# Patient Record
Sex: Male | Born: 1995 | Race: Black or African American | Hispanic: No | Marital: Single | Smoking: Former smoker
Health system: Southern US, Community
[De-identification: ages and names within clinical notes are randomized; demographics above are authoritative.]

---
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
## 2020-08-31 ENCOUNTER — Other Ambulatory Visit: Payer: Self-pay

## 2020-08-31 ENCOUNTER — Emergency Department (HOSPITAL_BASED_OUTPATIENT_CLINIC_OR_DEPARTMENT_OTHER)
Admission: EM | Admit: 2020-08-31 | Discharge: 2020-09-01 | Disposition: A | Discharge status: Home / Self Care | Payer: Self-pay | Attending: Emergency Medicine | Admitting: Emergency Medicine

## 2020-08-31 ENCOUNTER — Encounter (HOSPITAL_BASED_OUTPATIENT_CLINIC_OR_DEPARTMENT_OTHER): Payer: Self-pay | Admitting: Emergency Medicine

## 2020-08-31 DIAGNOSIS — F17200 Nicotine dependence, unspecified, uncomplicated: Secondary | ICD-10-CM | POA: Insufficient documentation

## 2020-08-31 DIAGNOSIS — S0232XA Fracture of orbital floor, left side, initial encounter for closed fracture: Secondary | ICD-10-CM | POA: Insufficient documentation
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
## 2020-09-01 ENCOUNTER — Emergency Department (HOSPITAL_BASED_OUTPATIENT_CLINIC_OR_DEPARTMENT_OTHER): Payer: Self-pay

## 2020-09-01 MED ORDER — ACETAMINOPHEN 500 MG PO TABS
1000.0000 mg | ORAL_TABLET | Freq: Once | ORAL | Status: AC
  Administered 2020-09-01: 1000 mg via ORAL
  Filled 2020-09-01: qty 2

## 2020-09-01 MED ORDER — AMOXICILLIN 500 MG PO CAPS: 500.0000 mg | ORAL_CAPSULE | Freq: Three times a day (TID) | ORAL | 0 refills | Status: DC

## 2020-09-01 NOTE — ED Notes (Signed)
ED Provider at bedside. 
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
## 2022-09-16 ENCOUNTER — Other Ambulatory Visit: Payer: Self-pay

## 2022-09-16 ENCOUNTER — Encounter (HOSPITAL_BASED_OUTPATIENT_CLINIC_OR_DEPARTMENT_OTHER): Payer: Self-pay | Admitting: Emergency Medicine

## 2022-09-16 ENCOUNTER — Emergency Department (HOSPITAL_BASED_OUTPATIENT_CLINIC_OR_DEPARTMENT_OTHER)
Admission: EM | Admit: 2022-09-16 | Discharge: 2022-09-16 | Disposition: A | Discharge status: Home / Self Care | Payer: Self-pay | Attending: Emergency Medicine | Admitting: Emergency Medicine

## 2022-09-16 DIAGNOSIS — Z1152 Encounter for screening for COVID-19: Secondary | ICD-10-CM | POA: Insufficient documentation

## 2022-09-16 DIAGNOSIS — J0101 Acute recurrent maxillary sinusitis: Secondary | ICD-10-CM | POA: Insufficient documentation

## 2022-09-16 DIAGNOSIS — J029 Acute pharyngitis, unspecified: Secondary | ICD-10-CM | POA: Insufficient documentation

## 2022-09-16 DIAGNOSIS — J0100 Acute maxillary sinusitis, unspecified: Secondary | ICD-10-CM

## 2022-09-16 DIAGNOSIS — Z720 Tobacco use: Secondary | ICD-10-CM | POA: Insufficient documentation

## 2022-09-16 LAB — MONONUCLEOSIS SCREEN: Mono Screen: NEGATIVE

## 2022-09-16 LAB — RESP PANEL BY RT-PCR (RSV, FLU A&B, COVID)  RVPGX2
Influenza A by PCR: NEGATIVE
Influenza B by PCR: NEGATIVE
Resp Syncytial Virus by PCR: NEGATIVE
SARS Coronavirus 2 by RT PCR: NEGATIVE

## 2022-09-16 LAB — GROUP A STREP BY PCR: Group A Strep by PCR: NOT DETECTED

## 2022-09-16 MED ORDER — DOXYCYCLINE HYCLATE 100 MG PO CAPS: 100.0000 mg | ORAL_CAPSULE | Freq: Two times a day (BID) | ORAL | 0 refills | Status: DC

## 2022-09-16 MED ORDER — DOXYCYCLINE HYCLATE 100 MG PO TABS
100.0000 mg | ORAL_TABLET | Freq: Once | ORAL | Status: AC
  Administered 2022-09-16: 100 mg via ORAL
  Filled 2022-09-16: qty 1

## 2022-09-16 MED ORDER — KETOROLAC TROMETHAMINE 30 MG/ML IJ SOLN
30.0000 mg | Freq: Once | INTRAMUSCULAR | Status: AC
  Administered 2022-09-16: 30 mg via INTRAMUSCULAR
  Filled 2022-09-16: qty 1

## 2022-09-16 MED ORDER — DEXAMETHASONE SODIUM PHOSPHATE 10 MG/ML IJ SOLN
10.0000 mg | Freq: Once | INTRAMUSCULAR | Status: AC
  Administered 2022-09-16: 10 mg via INTRAMUSCULAR
  Filled 2022-09-16: qty 1

## 2022-09-16 MED ORDER — IBUPROFEN 600 MG PO TABS: 600.0000 mg | ORAL_TABLET | Freq: Four times a day (QID) | ORAL | 0 refills | Status: DC | PRN

## 2022-09-16 NOTE — ED Triage Notes (Signed)
Pt reports he went to urgent care yesterday for head congestion and URI symptoms. Was given promethazine, but says throat and eyes are more swollen today and feels hot. Pt speaking in clear sentences. Denies itching or emesis.

## 2022-09-16 NOTE — ED Provider Notes (Signed)
EMERGENCY DEPARTMENT AT [facility] Provider Note   CSN: [id number] Arrival date & time: 09/16/22  0750     History  Chief Complaint  Patient presents with   Sore Throat    David Douglas is a 27 y.o. male.  Pt is a 27 yo male with no significant pmhx.  Pt said he's had uri sx for the past week.  [facility] and [facility] for covid/flu and strep.  These were neg.  He was given a rx for tessalon and phenergan dm.  He woke up today feeling worse.  He has some swelling to the left side of his face which is new.  No fevers.       Home Medications Prior to Admission medications   Medication Sig Start Date End Date Taking? Authorizing Provider  doxycycline (VIBRAMYCIN) 100 MG capsule Take 1 capsule (100 mg total) by mouth 2 (two) times daily. 09/16/22  Yes Jacalyn Lefevre, MD  ibuprofen (ADVIL) 600 MG tablet Take 1 tablet (600 mg total) by mouth every 6 (six) hours as needed. 09/16/22  Yes Jacalyn Lefevre, MD  promethazine (PHENERGAN) 12.5 MG tablet Take 12.5 mg by mouth every 6 (six) hours as needed for nausea or vomiting.   Yes [provider]  amoxicillin (AMOXIL) 500 MG capsule Take 1 capsule (500 mg total) by mouth 3 (three) times daily. 09/01/20   Tilden Fossa, MD      Allergies    Patient has no known allergies.    Review of Systems   Review of Systems  HENT:  Positive for facial swelling, rhinorrhea, sinus pressure, sinus pain and sore throat.   All other systems reviewed and are negative.   Physical Exam Updated Vital Signs BP (!) 146/78 (BP Location: Right Arm)   Pulse (!) 106   Temp 99.2 F (37.3 C) (Oral)   Resp 20   SpO2 97%  Physical Exam Vitals and nursing note reviewed.  Constitutional:      Appearance: He is well-developed.  HENT:     Head: Normocephalic and atraumatic.     Comments: Mild swelling under left eye.    Right Ear: Tympanic membrane and ear canal normal.     Left Ear: Tympanic membrane  and ear canal normal.     Nose: Congestion present.     Right Sinus: Maxillary sinus tenderness present.     Left Sinus: Maxillary sinus tenderness present.     Mouth/Throat:     Mouth: Mucous membranes are moist.  Eyes:     Conjunctiva/sclera: Conjunctivae normal.     Pupils: Pupils are equal, round, and reactive to light.  Cardiovascular:     Rate and Rhythm: Normal rate and regular rhythm.     Heart sounds: Normal heart sounds.  Pulmonary:     Effort: Pulmonary effort is normal.     Breath sounds: Normal breath sounds.  Abdominal:     General: Bowel sounds are normal.     Palpations: Abdomen is soft.  Musculoskeletal:     Cervical back: Normal range of motion and neck supple.  Skin:    General: Skin is warm.     Capillary Refill: Capillary refill takes less than 2 seconds.  Neurological:     General: No focal deficit present.     Mental Status: He is alert and oriented to person, place, and time.  Psychiatric:        Mood and Affect: Mood normal.  Behavior: Behavior normal.     ED Results / Procedures / Treatments   Labs (all labs ordered are listed, but only abnormal results are displayed) Labs Reviewed  RESP PANEL BY RT-PCR (RSV, FLU A&B, COVID)  RVPGX2  GROUP A STREP BY PCR  MONONUCLEOSIS SCREEN    EKG None  Radiology No results found.  Procedures Procedures    Medications Ordered in ED Medications  doxycycline (VIBRA-TABS) tablet 100 mg (has no administration in time range)  ketorolac (TORADOL) 30 MG/ML injection 30 mg (30 mg Intramuscular Given 09/16/22 0816)  dexamethasone (DECADRON) injection 10 mg (10 mg Intramuscular Given 09/16/22 1610)    ED Course/ Medical Decision Making/ A&P                             Medical Decision Making Amount and/or Complexity of Data Reviewed Labs: ordered.  Risk Prescription drug management.   This patient presents to the ED for concern of uri sx, this involves an extensive number of treatment  options, and is a complaint that carries with it a high risk of complications and morbidity.  The differential diagnosis includes covid/flu/rsv, strep, bronchitis, sinusitis   Co morbidities that complicate the patient evaluation  none   Additional history obtained:  Additional history obtained from epic chart review  Lab Tests:  I Ordered, and personally interpreted labs.  The pertinent results include:  monospot neg, strep neg, covid/flu/rsv neg   Medicines ordered and prescription drug management:  I ordered medication including toradol and decadron  for sx  Reevaluation of the patient after these medicines showed that the patient improved I have reviewed the patients home medicines and have made adjustments as needed   Problem List / ED Course:  URI sx:  sx have been going on for a week, so I will start him on abx.  Pt is stable for d/c.  Return if worse. Tobacco abuse:  pt encouraged to try to stop smoking.   Reevaluation:  After the interventions noted above, I reevaluated the patient and found that they have :improved   Social Determinants of Health:  No insurance/pcp   Dispostion:  After consideration of the diagnostic results and the patients response to treatment, I feel that the patent would benefit from discharge with outpatient f/u.          Final Clinical Impression(s) / ED Diagnoses Final diagnoses:  Acute non-recurrent maxillary sinusitis  Pharyngitis, unspecified etiology  Tobacco abuse    Rx / DC Orders ED Discharge Orders          Ordered    doxycycline (VIBRAMYCIN) 100 MG capsule  2 times daily        09/16/22 0930    ibuprofen (ADVIL) 600 MG tablet  Every 6 hours PRN        09/16/22 0930              Jacalyn Lefevre, MD 09/16/22 [phone]

## 2022-09-16 NOTE — Discharge Instructions (Addendum)
Try to stop smoking. °

## 2024-01-20 ENCOUNTER — Other Ambulatory Visit: Payer: Self-pay

## 2024-01-20 ENCOUNTER — Encounter (HOSPITAL_BASED_OUTPATIENT_CLINIC_OR_DEPARTMENT_OTHER): Payer: Self-pay | Admitting: Emergency Medicine

## 2024-01-20 ENCOUNTER — Emergency Department (HOSPITAL_BASED_OUTPATIENT_CLINIC_OR_DEPARTMENT_OTHER): Payer: Self-pay

## 2024-01-20 ENCOUNTER — Emergency Department (HOSPITAL_BASED_OUTPATIENT_CLINIC_OR_DEPARTMENT_OTHER)
Admission: EM | Admit: 2024-01-20 | Discharge: 2024-01-20 | Disposition: A | Discharge status: Home / Self Care | Payer: Self-pay

## 2024-01-20 DIAGNOSIS — U071 COVID-19: Secondary | ICD-10-CM | POA: Insufficient documentation

## 2024-01-20 LAB — RESP PANEL BY RT-PCR (RSV, FLU A&B, COVID)  RVPGX2
Influenza A by PCR: NEGATIVE
Influenza B by PCR: NEGATIVE
Resp Syncytial Virus by PCR: NEGATIVE
SARS Coronavirus 2 by RT PCR: POSITIVE — AB

## 2024-01-20 NOTE — Discharge Instructions (Signed)
 You were evaluated in the emergency room for cough.  You are found to have COVID-19..You may use Tylenol  1000 mg and/or Motrin  600 mg every 4-6 hours up to 3 times a day for fever or body aches.  Please keep in mind that this dosing is not meant to be continued long-term and that many over-the-counter cough and flu medications contain acetaminophen  or ibuprofen . You can expect your current symptoms to linger over the next week or two but please return to the emergency room if you experience any new or worsening symptoms including persistent fevers, worsening productive cough and persistent vomiting. Please follow-up with your primary care provider regarding your ER visit.

## 2024-01-20 NOTE — ED Notes (Signed)

## 2024-01-20 NOTE — ED Triage Notes (Signed)
 Productive cough since yesterday, blood noted yesterday. Today brown tinged mucus

## 2024-01-20 NOTE — ED Provider Notes (Signed)
 [place] [facility] Provider Note   CSN: [id number] Arrival date & time: 01/20/24  0830     Patient presents with: Cough   David Douglas is a 28 y.o. male otherwise healthy presents with complaints of URI symptoms that started yesterday.  Endorses subjective fevers, chills and productive cough.  Notes blood-tinged sputum.  Denies any shortness of breath.  No injury or trauma to the chest.  No history of blood clots, recent travel, surgeries, hospitalizations.    Cough    History reviewed. No pertinent past medical history.   Prior to Admission medications   Medication Sig Start Date End Date Taking? Authorizing Provider  amoxicillin  (AMOXIL ) 500 MG capsule Take 1 capsule (500 mg total) by mouth 3 (three) times daily. 09/01/20   Griselda Norris, MD  doxycycline  (VIBRAMYCIN ) 100 MG capsule Take 1 capsule (100 mg total) by mouth 2 (two) times daily. 09/16/22   Dean Clarity, MD  ibuprofen  (ADVIL ) 600 MG tablet Take 1 tablet (600 mg total) by mouth every 6 (six) hours as needed. 09/16/22   Haviland, Julie, MD  promethazine (PHENERGAN) 12.5 MG tablet Take 12.5 mg by mouth every 6 (six) hours as needed for nausea or vomiting.    [provider]    Allergies: Promethazine    Review of Systems  Respiratory:  Positive for cough.     Updated Vital Signs BP (!) 120/90   Pulse 83   Temp 98.3 F (36.8 C) (Oral)   Resp 18   SpO2 100%   Physical Exam Vitals and nursing note reviewed.  Constitutional:      General: He is not in acute distress.    Appearance: He is well-developed.  HENT:     Head: Normocephalic and atraumatic.  Eyes:     Conjunctiva/sclera: Conjunctivae normal.  Cardiovascular:     Rate and Rhythm: Normal rate and regular rhythm.     Heart sounds: No murmur heard. Pulmonary:     Effort: Pulmonary effort is normal. No respiratory distress.     Breath sounds: Normal breath sounds.  Abdominal:     Palpations:  Abdomen is soft.     Tenderness: There is no abdominal tenderness.  Musculoskeletal:        General: No swelling.     Cervical back: Neck supple.  Skin:    General: Skin is warm and dry.     Capillary Refill: Capillary refill takes less than 2 seconds.  Neurological:     Mental Status: He is alert.  Psychiatric:        Mood and Affect: Mood normal.     (all labs ordered are listed, but only abnormal results are displayed) Labs Reviewed  RESP PANEL BY RT-PCR (RSV, FLU A&B, COVID)  RVPGX2 - Abnormal; Notable for the following components:      Result Value   SARS Coronavirus 2 by RT PCR POSITIVE (*)    All other components within normal limits    EKG: None  Radiology: DG Chest 2 View Result Date: 01/20/2024 CLINICAL DATA:  Productive cough EXAM: CHEST - 2 VIEW COMPARISON:  None Available. FINDINGS: The heart size and mediastinal contours are within normal limits. Both lungs are clear. The visualized skeletal structures are unremarkable. IMPRESSION: No active cardiopulmonary disease. Electronically Signed   By: Camellia Candle M.D.   On: 01/20/2024 09:54     Procedures   Medications Ordered in the ED - No data to display  Clinical Course as of 01/20/24  1009  Sun Jan 20, 2024  1006 Otherwise healthy male evaluated for URI symptoms with hemoptysis.  He is hemodynamically stable.  His exam is entirely benign.  His chest x-ray is clear.  He is positive for COVID.  He will be discharged home with supportive management.  Not feel that any advanced imaging is indicated at this time.  Patient is agreeable to plan. [JT]    Clinical Course User Index [JT] Donnajean Lynwood DEL, PA-C                                 Medical Decision Making Amount and/or Complexity of Data Reviewed Radiology: ordered.   This patient presents to the ED with chief complaint(s) of cough .  The complaint involves an extensive differential diagnosis and also carries with it a high risk of complications and  morbidity.   Pertinent past medical history as listed in HPI  The differential diagnosis includes  Considered pneumonia however patient's lung sounds are clear and he is afebrile without any opacities on x-ray.  He has no risk factors for PE and no other signs or symptoms to suggest malignancy. Additional history obtained: Records reviewed Care Everywhere/External Records  Disposition:   Patient will be discharged home. The patient has been appropriately medically screened and/or stabilized in the ED. I have low suspicion for any other emergent medical condition which would require further screening, evaluation or treatment in the ED or require inpatient management. At time of discharge the patient is hemodynamically stable and in no acute distress. I have discussed work-up results and diagnosis with patient and answered all questions. Patient is agreeable with discharge plan. We discussed strict return precautions for returning to the emergency department and they verbalized understanding.     Social Determinants of Health:   Patient's impaired access to primary care  increases the complexity of managing their presentation  This note was dictated with voice recognition software.  Despite best efforts at proofreading, errors may have occurred which can change the documentation meaning.       Final diagnoses:  COVID-19    ED Discharge Orders     None          Donnajean Lynwood DEL DEVONNA 01/20/24 1009    Ula Prentice SAUNDERS, MD 01/20/24 1419

## 2024-02-08 ENCOUNTER — Other Ambulatory Visit: Payer: Self-pay

## 2024-02-08 ENCOUNTER — Encounter (HOSPITAL_BASED_OUTPATIENT_CLINIC_OR_DEPARTMENT_OTHER): Payer: Self-pay

## 2024-02-08 ENCOUNTER — Emergency Department (HOSPITAL_BASED_OUTPATIENT_CLINIC_OR_DEPARTMENT_OTHER): Payer: Self-pay

## 2024-02-08 ENCOUNTER — Emergency Department (HOSPITAL_BASED_OUTPATIENT_CLINIC_OR_DEPARTMENT_OTHER)
Admission: EM | Admit: 2024-02-08 | Discharge: 2024-02-08 | Disposition: A | Discharge status: Home / Self Care | Payer: Self-pay | Attending: Emergency Medicine | Admitting: Emergency Medicine

## 2024-02-08 DIAGNOSIS — M25511 Pain in right shoulder: Secondary | ICD-10-CM | POA: Insufficient documentation

## 2024-02-08 DIAGNOSIS — X500XXA Overexertion from strenuous movement or load, initial encounter: Secondary | ICD-10-CM | POA: Insufficient documentation

## 2024-02-08 MED ORDER — NAPROXEN 375 MG PO TABS: 375.0000 mg | ORAL_TABLET | Freq: Two times a day (BID) | ORAL | 0 refills | Status: AC

## 2024-02-08 NOTE — ED Triage Notes (Signed)
 Pt reports throwing right shoulder out last night while helping a family member move some things. Pt able to move arm freely. Denies numbness/tingling. Radial pulse intact. No obvious deformity.

## 2024-02-08 NOTE — Discharge Instructions (Addendum)
 Your x-ray did not show any acute findings.  We had a short course of indomethacin to help with your pain, please take 1 tablet with food twice a day for the next 5 days.  A referral to orthopedics was provided.  Please follow-up at your earliest convenience.

## 2024-02-08 NOTE — ED Provider Notes (Signed)
 [place] [facility] Provider Note   CSN: [id number] Arrival date & time: 02/08/24  1457     Patient presents with: Shoulder Injury   David Douglas is a 28 y.o. male.   28 y.o male with no PMH presents to the ED with a chief complaint of right shoulder pain s/p injury.  Patient reports he was helping his grandfather move, suddenly felt that the right shoulder was pulled.  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  Has pain around his forearm most of the days.  He has taken 1 Tylenol  to help with pain without much improvement.  He denies any trauma, fevers, prior surgical intervention to his right shoulder.  The history is provided by the patient.  Shoulder Injury       Prior to Admission medications   Medication Sig Start Date End Date Taking? Authorizing Provider  naproxen (NAPROSYN) 375 MG tablet Take 1 tablet (375 mg total) by mouth 2 (two) times daily for 5 days. 02/08/24 02/13/24 Yes Kensleigh Gates, PA-C  amoxicillin  (AMOXIL ) 500 MG capsule Take 1 capsule (500 mg total) by mouth 3 (three) times daily. 09/01/20   Griselda Norris, MD  doxycycline  (VIBRAMYCIN ) 100 MG capsule Take 1 capsule (100 mg total) by mouth 2 (two) times daily. 09/16/22   Dean Clarity, MD  ibuprofen  (ADVIL ) 600 MG tablet Take 1 tablet (600 mg total) by mouth every 6 (six) hours as needed. 09/16/22   Haviland, Julie, MD  promethazine (PHENERGAN) 12.5 MG tablet Take 12.5 mg by mouth every 6 (six) hours as needed for nausea or vomiting.    [provider]    Allergies: Promethazine    Review of Systems  Constitutional:  Negative for fever.  Musculoskeletal:  Positive for arthralgias.    Updated Vital Signs BP 136/89 (BP Location: Left Arm)   Pulse 85   Temp 98.6 F (37 C) (Oral)   Resp 16   Ht 5' 10 (1.778 m)   Wt 90.7 kg   SpO2 97%   BMI 28.70 kg/m   Physical Exam Vitals and nursing note reviewed.   Constitutional:      Appearance: Normal appearance.  HENT:     Head: Normocephalic and atraumatic.     Mouth/Throat:     Mouth: Mucous membranes are moist.  Cardiovascular:     Rate and Rhythm: Normal rate.  Pulmonary:     Effort: Pulmonary effort is normal.  Abdominal:     General: Abdomen is flat.  Musculoskeletal:        General: Tenderness present. No deformity or signs of injury.     Cervical back: Normal range of motion and neck supple.  Skin:    General: Skin is warm and dry.  Neurological:     Mental Status: He is alert and oriented to person, place, and time.     (all labs ordered are listed, but only abnormal results are displayed) Labs Reviewed - No data to display  EKG: None  Radiology: DG Shoulder Right Result Date: 02/08/2024 CLINICAL DATA:  Right shoulder pain. EXAM: RIGHT SHOULDER - 2+ VIEW COMPARISON:  None Available. FINDINGS: There is no evidence of fracture or dislocation. There is no evidence of arthropathy or other focal bone abnormality. Soft tissues are unremarkable. IMPRESSION: Negative. Electronically Signed   By: Suzen Dials M.D.   On: 02/08/2024 15:41     Procedures   Medications Ordered in the ED - No  data to display                                  Medical Decision Making Amount and/or Complexity of Data Reviewed Radiology: ordered.  Risk Prescription drug management.   Patient has to the ED with a chief complaint of right shoulder pain status post injury.  He reports he was helping his grandfather move some heavy furniture, felt a popping sensation to the right shoulder.  He reports there was no dislocation or subluxation that he noted.  Just sharp pain.  He is a cook in alignment work, does do a lot of repetitive movement complains of soreness to his forearms daily.  He does not have a PCP that he sees, did take some Tylenol  to help with improvement.  There was no fever, no limited range of motion on exam, he is otherwise  neurovascularly intact.  X-ray obtained today did not show any dislocation versus fracture.  We discussed his results of his x-ray, will follow-up with orthopedics as needed.  Given a short prescription of anti-inflammatories to help with pain control.  Patient is hemodynamically stable for discharge.   Portions of this note were generated with Scientist, clinical (histocompatibility and immunogenetics). Dictation errors may occur despite best attempts at proofreading.   Final diagnoses:  Acute pain of right shoulder    ED Discharge Orders          Ordered    naproxen (NAPROSYN) 375 MG tablet  2 times daily        02/08/24 1556               Margherita Collyer, PA-C 02/08/24 1609    Floyd, Dan, DO 02/08/24 1947

## 2024-02-22 ENCOUNTER — Emergency Department (HOSPITAL_BASED_OUTPATIENT_CLINIC_OR_DEPARTMENT_OTHER): Payer: Self-pay

## 2024-02-22 ENCOUNTER — Emergency Department (HOSPITAL_BASED_OUTPATIENT_CLINIC_OR_DEPARTMENT_OTHER)
Admission: EM | Admit: 2024-02-22 | Discharge: 2024-02-22 | Disposition: A | Discharge status: Home / Self Care | Payer: Self-pay | Attending: Emergency Medicine | Admitting: Emergency Medicine

## 2024-02-22 ENCOUNTER — Encounter (HOSPITAL_BASED_OUTPATIENT_CLINIC_OR_DEPARTMENT_OTHER): Payer: Self-pay | Admitting: Emergency Medicine

## 2024-02-22 ENCOUNTER — Other Ambulatory Visit: Payer: Self-pay

## 2024-02-22 DIAGNOSIS — R053 Chronic cough: Secondary | ICD-10-CM | POA: Insufficient documentation

## 2024-02-22 LAB — RESP PANEL BY RT-PCR (RSV, FLU A&B, COVID)  RVPGX2
Influenza A by PCR: NEGATIVE
Influenza B by PCR: NEGATIVE
Resp Syncytial Virus by PCR: NEGATIVE
SARS Coronavirus 2 by RT PCR: NEGATIVE

## 2024-02-22 MED ORDER — OMEPRAZOLE 20 MG PO CPDR: 20.0000 mg | DELAYED_RELEASE_CAPSULE | Freq: Every day | ORAL | 1 refills | Status: AC

## 2024-02-22 MED ORDER — CETIRIZINE HCL 10 MG PO TABS: 10.0000 mg | ORAL_TABLET | Freq: Every day | ORAL | 1 refills | Status: AC

## 2024-02-22 NOTE — ED Notes (Signed)
 Dc instructions given, pt verbalized understanding, out of ED with all belongings and paperwork not in visible distress.

## 2024-02-22 NOTE — ED Provider Notes (Signed)
 [place] [facility] Provider Note   CSN: [id number] Arrival date & time: 02/22/24  1442     Patient presents with: Cough   David Douglas is a 28 y.o. male.   Patient with no significant past medical history presents to the emergency department for evaluation of cough and sputum production over the past 3 months.  Symptoms occur daily.  No fevers, URI symptoms such as sinus pressure, nasal congestion or sore throat.  Patient reports that he is a smoker.  Denies wheezing.  No known allergies but does work around some respiratory irritants.  He reports occasional reflux or heartburn symptoms.  He denies shortness of breath or chest pain.  He wanted to be checked today because the symptoms were ongoing, not that anything became acutely worse.       Prior to Admission medications   Medication Sig Start Date End Date Taking? Authorizing Provider  amoxicillin  (AMOXIL ) 500 MG capsule Take 1 capsule (500 mg total) by mouth 3 (three) times daily. 09/01/20   Griselda Norris, MD  doxycycline  (VIBRAMYCIN ) 100 MG capsule Take 1 capsule (100 mg total) by mouth 2 (two) times daily. 09/16/22   Dean Clarity, MD  ibuprofen  (ADVIL ) 600 MG tablet Take 1 tablet (600 mg total) by mouth every 6 (six) hours as needed. 09/16/22   Haviland, Julie, MD  promethazine (PHENERGAN) 12.5 MG tablet Take 12.5 mg by mouth every 6 (six) hours as needed for nausea or vomiting.    [provider]    Allergies: Promethazine    Review of Systems  Updated Vital Signs BP 129/67 (BP Location: Right Arm)   Pulse 84   Temp 98.3 F (36.8 C)   Resp 18   Ht 5' 10 (1.778 m)   Wt 90.7 kg   SpO2 98%   BMI 28.70 kg/m   Physical Exam Vitals and nursing note reviewed.  Constitutional:      Appearance: He is well-developed.  HENT:     Head: Normocephalic and atraumatic.     Jaw: No trismus.     Right Ear: Tympanic membrane, ear canal and external ear normal.     Left Ear:  Tympanic membrane, ear canal and external ear normal.     Nose: Nose normal. No mucosal edema, congestion or rhinorrhea.     Mouth/Throat:     Mouth: Mucous membranes are not dry.     Pharynx: Uvula midline. No oropharyngeal exudate, posterior oropharyngeal erythema or uvula swelling.     Tonsils: No tonsillar abscesses.  Eyes:     General:        Right eye: No discharge.        Left eye: No discharge.     Conjunctiva/sclera: Conjunctivae normal.  Cardiovascular:     Rate and Rhythm: Normal rate and regular rhythm.     Heart sounds: Normal heart sounds.  Pulmonary:     Effort: Pulmonary effort is normal. No respiratory distress.     Breath sounds: Normal breath sounds. No wheezing or rales.     Comments: Lungs clear to auscultation bilaterally, no wheezing. Abdominal:     Palpations: Abdomen is soft.     Tenderness: There is no abdominal tenderness.  Musculoskeletal:     Cervical back: Normal range of motion and neck supple.  Skin:    General: Skin is warm and dry.  Neurological:     Mental Status: He is alert.     (all labs ordered are listed, but  only abnormal results are displayed) Labs Reviewed  RESP PANEL BY RT-PCR (RSV, FLU A&B, COVID)  RVPGX2    EKG: None  Radiology: No results found.   Procedures   Medications Ordered in the ED - No data to display  ED Course  Patient seen and examined. History obtained directly from patient. Work-up including labs, imaging, EKG ordered in triage, if performed, were reviewed.    Labs/EKG: Independently reviewed and interpreted.  This included: Viral panel agree negative.  Imaging: Independently reviewed and interpreted.  This included: Chest x-ray, agree negative.  Medications/Fluids: None ordered  Most recent vital signs reviewed and are as follows: BP 129/67 (BP Location: Right Arm)   Pulse 84   Temp 98.3 F (36.8 C)   Resp 18   Ht 5' 10 (1.778 m)   Wt 90.7 kg   SpO2 98%   BMI 28.70 kg/m   Initial  impression: Chronic cough and mucus production.  Discussed possible etiologies of chronic cough given symptoms now for 3 months.  Will give empiric treatment for allergies/postnasal drip, and GERD.  Home treatment plan: Trial cetirizine and omeprazole.  Return instructions discussed with patient: Return with worsening shortness of breath, fever, new or worsening symptoms.  Follow-up instructions discussed with patient: Follow-up with PCP in the next month if symptoms or not improving.                                 Medical Decision Making Amount and/or Complexity of Data Reviewed Radiology: ordered.   Well-appearing patient with reassuring exam.  He has had cough for about 3 months.  He reports increased sputum production at times.  Symptoms occur daily.  Chest x-ray is clear.  Viral panel negative.  Considered most common reasons of chronic cough including asthma which patient does not have a history of, postnasal drip allergies, reflux.  Patient is a smoker which is likely contributing.  Will empirically treat with cetirizine and omeprazole to see if this helps improve his symptoms.  If not improving, recommend PCP follow-up.     Final diagnoses:  Chronic cough    ED Discharge Orders          Ordered    cetirizine (ZYRTEC) 10 MG tablet  Daily        02/22/24 1638    omeprazole (PRILOSEC) 20 MG capsule  Daily        02/22/24 1638               Desiderio Chew, PA-C 02/22/24 1639    Lenor Hollering, MD 02/22/24 1719

## 2024-02-22 NOTE — ED Triage Notes (Signed)
 Pt reports he has been coughing up phlegm for a couple months

## 2024-02-22 NOTE — Discharge Instructions (Signed)
 Please read and follow all provided instructions.  Your diagnoses today include:  1. Chronic cough    Tests performed today include: Flu, COVID, RSV testing: Was negative Chest x-ray: Was clear, no signs of problems Vital signs. See below for your results today.   Medications prescribed:  Cetirizine: Generic Claritin to trial in case your cough is being caused by postnasal drip or allergies  Omeprazole (Prilosec) - stomach acid reducer  This medication can be found over-the-counter  Take any prescribed medications only as directed.  Home care instructions:  Follow any educational materials contained in this packet.  Try to decrease smoking over time as this will help with your coughing and breathing over the long-term.  Will also help decrease your risk of lung cancer and lung disease.  BE VERY CAREFUL not to take multiple medicines containing Tylenol  (also called acetaminophen ). Doing so can lead to an overdose which can damage your liver and cause liver failure and possibly death.   Follow-up instructions: Please follow-up with your primary care provider as needed for further evaluation of your symptoms.   Return instructions:  Please return to the Emergency Department if you experience worsening symptoms.  Please return if you have any other emergent concerns.  Additional Information:  Your vital signs today were: BP 129/67 (BP Location: Right Arm)   Pulse 84   Temp 98.3 F (36.8 C)   Resp 18   Ht 5' 10 (1.778 m)   Wt 90.7 kg   SpO2 98%   BMI 28.70 kg/m  If your blood pressure (BP) was elevated above 135/85 this visit, please have this repeated by your doctor within one month. --------------

## 2024-03-10 ENCOUNTER — Emergency Department (HOSPITAL_BASED_OUTPATIENT_CLINIC_OR_DEPARTMENT_OTHER)
Admission: EM | Admit: 2024-03-10 | Discharge: 2024-03-10 | Disposition: A | Discharge status: Home / Self Care | Payer: Self-pay | Attending: Emergency Medicine | Admitting: Emergency Medicine

## 2024-03-10 ENCOUNTER — Other Ambulatory Visit: Payer: Self-pay

## 2024-03-10 ENCOUNTER — Encounter (HOSPITAL_BASED_OUTPATIENT_CLINIC_OR_DEPARTMENT_OTHER): Payer: Self-pay

## 2024-03-10 DIAGNOSIS — Y93G1 Activity, food preparation and clean up: Secondary | ICD-10-CM | POA: Insufficient documentation

## 2024-03-10 DIAGNOSIS — S61112A Laceration without foreign body of left thumb with damage to nail, initial encounter: Secondary | ICD-10-CM | POA: Insufficient documentation

## 2024-03-10 DIAGNOSIS — Y990 Civilian activity done for income or pay: Secondary | ICD-10-CM | POA: Insufficient documentation

## 2024-03-10 DIAGNOSIS — Z23 Encounter for immunization: Secondary | ICD-10-CM | POA: Insufficient documentation

## 2024-03-10 DIAGNOSIS — X58XXXA Exposure to other specified factors, initial encounter: Secondary | ICD-10-CM | POA: Insufficient documentation

## 2024-03-10 MED ORDER — TETANUS-DIPHTH-ACELL PERTUSSIS 5-2-15.5 LF-MCG/0.5 IM SUSP
0.5000 mL | Freq: Once | INTRAMUSCULAR | Status: AC
  Administered 2024-03-10: 0.5 mL via INTRAMUSCULAR
  Filled 2024-03-10: qty 0.5

## 2024-03-10 NOTE — ED Provider Notes (Signed)
 [place] [facility] Provider Note   CSN: [id number] Arrival date & time: 03/10/24  9151     Patient presents with: Extremity Laceration   David Douglas is a 28 y.o. male.   28 year old male presenting with laceration to left thumb.  Laceration occurred at work this morning while he was cutting a sandwich, he cut the tip of his left thumbnail off but does not believe that he may contact with his skin.  The thumb was cleaned and wrapped at his work before he presented to the emergency department today.  No other injuries to report.  He is unsure if he is up-to-date on his tetanus vaccine. He is not on blood thinners.        Prior to Admission medications   Medication Sig Start Date End Date Taking? Authorizing Provider  cetirizine (ZYRTEC) 10 MG tablet Take 1 tablet (10 mg total) by mouth daily. 02/22/24   Geiple, Joshua, PA-C  omeprazole (PRILOSEC) 20 MG capsule Take 1 capsule (20 mg total) by mouth daily. 02/22/24   Geiple, Joshua, PA-C  promethazine (PHENERGAN) 12.5 MG tablet Take 12.5 mg by mouth every 6 (six) hours as needed for nausea or vomiting.    [provider]    Allergies: Promethazine    Review of Systems  Updated Vital Signs  Vitals:   03/10/24 0858 03/10/24 0859  BP:  133/83  Pulse:  90  Resp:  16  Temp:  97.7 F (36.5 C)  TempSrc:  Oral  SpO2:  96%  Weight: 90.7 kg   Height: 5' 10 (1.778 m)      Physical Exam Vitals and nursing note reviewed.  HENT:     Head: Normocephalic.  Eyes:     Extraocular Movements: Extraocular movements intact.  Cardiovascular:     Rate and Rhythm: Normal rate.  Pulmonary:     Effort: Pulmonary effort is normal.  Musculoskeletal:     Cervical back: Normal range of motion.     Comments: Moves all extremities spontaneously without difficulty L thumb: Full ROM, neurovascularly in-tact  Skin:    General: Skin is warm and dry.     Comments: Distal portion of left  thumbnail is avulsed, minimal bleeding, no visualized laceration to the nailbed, see photo  Neurological:     Mental Status: He is alert and oriented to person, place, and time.     (all labs ordered are listed, but only abnormal results are displayed) Labs Reviewed - No data to display  EKG: None  Radiology: No results found.   Procedures   Medications Ordered in the ED  Tdap (ADACEL) injection 0.5 mL (has no administration in time range)                                    Medical Decision Making This patient presents to the ED for concern of laceration, this involves an extensive number of treatment options, and is a complaint that carries with it a high risk of complications and morbidity.  The differential diagnosis includes laceration, laceration with nail involvement, laceration with foreign body, laceration with bony involvement   Cardiac Monitoring: / EKG:  The patient was maintained on a cardiac monitor.  I personally viewed and interpreted the cardiac monitored which showed an underlying rhythm of: NSR   Problem List / ED Course / Critical interventions / Medication management   I ordered medication  including Tdap for updating tetanus immunization I have reviewed the patients home medicines and have made adjustments as needed   Social Determinants of Health:  Former tobacco use   Test / Admission - Considered:  Physical exam is notable as above, see photo.  Patient with laceration to left distal thumb, distal nail is partially avulsed, does not appear to be an open laceration to the nailbed requiring repair as the wound itself is rather superficial and the bleeding seems to be from the nail avulsion only.  Given the superficial nature of this wound I do not feel that x-ray imaging is warranted, patient has full range of motion at the thumb and is neurovascularly intact.  I cleaned the nail with normal saline and patient's thumb was dressed with a nonadherent  dressing prior to discharge.  Tetanus status was updated here.  I discussed return precautions, patient voiced understanding is in agreement with this plan, he is appropriate for discharge at this time.    Risk Prescription drug management.        Final diagnoses:  Laceration of left thumb without foreign body with damage to nail, initial encounter    ED Discharge Orders     None          Glendia Rocky SAILOR, [place] 03/10/24 9074    Freddi Glendia, MD 03/10/24 [phone]

## 2024-03-10 NOTE — ED Triage Notes (Signed)
 Laceration through left thumb nail while cutting sandwich this morning. Unsure of last tetanus, gauze in place upon arrival.

## 2024-03-10 NOTE — Discharge Instructions (Signed)
 Keep your thumb clean and dry for the first 24 hours.  Monitor for signs/symptoms of wound infection, including worsening redness/swelling/warmth/discharge from the wound.  Continue Tylenol  or ibuprofen  as needed for pain.  Return to the emergency department if your symptoms worsen.  I have provided you with contact information for [facility], please schedule a follow-up to establish care since you do not have a PCP locally.

## 2024-05-01 ENCOUNTER — Emergency Department (HOSPITAL_BASED_OUTPATIENT_CLINIC_OR_DEPARTMENT_OTHER)
Admission: EM | Admit: 2024-05-01 | Discharge: 2024-05-01 | Disposition: A | Discharge status: Home / Self Care | Payer: Self-pay | Attending: Emergency Medicine | Admitting: Emergency Medicine

## 2024-05-01 ENCOUNTER — Other Ambulatory Visit: Payer: Self-pay

## 2024-05-01 ENCOUNTER — Encounter (HOSPITAL_BASED_OUTPATIENT_CLINIC_OR_DEPARTMENT_OTHER): Payer: Self-pay

## 2024-05-01 DIAGNOSIS — R1111 Vomiting without nausea: Secondary | ICD-10-CM | POA: Insufficient documentation

## 2024-05-01 MED ORDER — ONDANSETRON HCL 4 MG PO TABS: 4.0000 mg | ORAL_TABLET | Freq: Four times a day (QID) | ORAL | 0 refills | Status: AC

## 2024-05-01 MED ORDER — ONDANSETRON 4 MG PO TBDP: 4.0000 mg | ORAL_TABLET | Freq: Once | ORAL | Status: DC

## 2024-05-01 NOTE — ED Provider Notes (Signed)
" °  [place] [facility] Provider Note   CSN: [id number] Arrival date & time: 05/01/24  1540     Patient presents with: Emesis   David Douglas is a 29 y.o. male.   29 year old male who is here today because he vomited yesterday and his work requires a work note for him to return.  He thinks he may have eaten some bad hibachi yesterday.  He otherwise feels fine.   Emesis      Prior to Admission medications  Medication Sig Start Date End Date Taking? Authorizing Provider  ondansetron (ZOFRAN) 4 MG tablet Take 1 tablet (4 mg total) by mouth every 6 (six) hours. 05/01/24  Yes Mannie Pac T, DO  cetirizine  (ZYRTEC ) 10 MG tablet Take 1 tablet (10 mg total) by mouth daily. 02/22/24   Desiderio Chew, PA-C  omeprazole  (PRILOSEC) 20 MG capsule Take 1 capsule (20 mg total) by mouth daily. 02/22/24   Geiple, Joshua, PA-C  promethazine (PHENERGAN) 12.5 MG tablet Take 12.5 mg by mouth every 6 (six) hours as needed for nausea or vomiting.    [provider]    Allergies: Promethazine    Review of Systems  Gastrointestinal:  Positive for vomiting.    Updated Vital Signs BP (!) 132/93 (BP Location: Right Arm)   Pulse 100   Temp 98.8 F (37.1 C)   Resp 15   Wt 111.1 kg   SpO2 98%   BMI 35.15 kg/m   Physical Exam Vitals and nursing note reviewed.  Constitutional:      Appearance: He is not toxic-appearing.  Cardiovascular:     Rate and Rhythm: Normal rate.  Pulmonary:     Effort: Pulmonary effort is normal.  Abdominal:     General: Abdomen is flat.     Palpations: Abdomen is soft.  Neurological:     General: No focal deficit present.     Mental Status: He is alert.     (all labs ordered are listed, but only abnormal results are displayed) Labs Reviewed - No data to display  EKG: None  Radiology: No results found.   Procedures   Medications Ordered in the ED - No data to display                                   Medical Decision Making 29 year old male here today because he requires a work note to return to work.  Plan -this work note has been provided for the patient.  He does not require any intervention at this time.  His physical exam is unremarkable and he overall looks well.  He has normal vital signs.  Sent the patient a prescription for Zofran for episodes of gastroenteritis.  This patient's health care is complicated by the following social determinants of health-lack of access to primary care.        Final diagnoses:  Vomiting without nausea, unspecified vomiting type    ED Discharge Orders          Ordered    ondansetron (ZOFRAN) 4 MG tablet  Every 6 hours        05/01/24 1652               Mannie Pac T, DO 05/01/24 1652  "

## 2024-05-01 NOTE — ED Triage Notes (Signed)
 Pt arrives with after having one episode of vomiting yesterday around 15:00. Per pt, he thinks he may have had some bad hibachi, but some of his coworkers have been sick recently. Pt has no complaints at this time.

## 2024-05-09 ENCOUNTER — Other Ambulatory Visit: Payer: Self-pay

## 2024-05-09 ENCOUNTER — Emergency Department (HOSPITAL_BASED_OUTPATIENT_CLINIC_OR_DEPARTMENT_OTHER)
Admission: EM | Admit: 2024-05-09 | Discharge: 2024-05-09 | Disposition: A | Discharge status: Home / Self Care | Payer: Self-pay

## 2024-05-09 ENCOUNTER — Encounter (HOSPITAL_BASED_OUTPATIENT_CLINIC_OR_DEPARTMENT_OTHER): Payer: Self-pay

## 2024-05-09 DIAGNOSIS — J111 Influenza due to unidentified influenza virus with other respiratory manifestations: Secondary | ICD-10-CM

## 2024-05-09 DIAGNOSIS — R6883 Chills (without fever): Secondary | ICD-10-CM | POA: Insufficient documentation

## 2024-05-09 DIAGNOSIS — M791 Myalgia, unspecified site: Secondary | ICD-10-CM | POA: Insufficient documentation

## 2024-05-09 DIAGNOSIS — R0981 Nasal congestion: Secondary | ICD-10-CM | POA: Insufficient documentation

## 2024-05-09 DIAGNOSIS — R059 Cough, unspecified: Secondary | ICD-10-CM | POA: Insufficient documentation

## 2024-05-09 MED ORDER — ACETAMINOPHEN 325 MG PO TABS
650.0000 mg | ORAL_TABLET | Freq: Once | ORAL | Status: AC
  Administered 2024-05-09: 650 mg via ORAL
  Filled 2024-05-09: qty 2

## 2024-05-09 NOTE — ED Provider Notes (Signed)
 " [place] [facility] Provider Note   CSN: [id number] Arrival date & time: 05/09/24  1043     Patient presents with: Cough   David Douglas is a 29 y.o. male.   Patient presents to the emergency department today for evaluation of influenza-like illness.  Symptoms started this morning.  He is here with his girlfriend who tested positive for influenza type a 3 days ago.  Patient reports nasal congestion, cough, body aches, chills.  He has a temperature of 100.1 F on arrival here today.  No shortness of breath or history of lung problems.  No vomiting or diarrhea.       Prior to Admission medications  Medication Sig Start Date End Date Taking? Authorizing Provider  cetirizine  (ZYRTEC ) 10 MG tablet Take 1 tablet (10 mg total) by mouth daily. 02/22/24   Desiderio Chew, PA-C  omeprazole  (PRILOSEC) 20 MG capsule Take 1 capsule (20 mg total) by mouth daily. 02/22/24   Desiderio Chew, PA-C  ondansetron  (ZOFRAN ) 4 MG tablet Take 1 tablet (4 mg total) by mouth every 6 (six) hours. 05/01/24   Mannie Fairy DASEN, DO  promethazine (PHENERGAN) 12.5 MG tablet Take 12.5 mg by mouth every 6 (six) hours as needed for nausea or vomiting.    [provider]    Allergies: Promethazine    Review of Systems  Updated Vital Signs BP (!) 156/82   Pulse 99   Temp 100.1 F (37.8 C) (Oral)   Resp 18   SpO2 100%   Physical Exam Vitals and nursing note reviewed.  Constitutional:      Appearance: He is well-developed.  HENT:     Head: Normocephalic and atraumatic.     Jaw: No trismus.     Right Ear: Tympanic membrane, ear canal and external ear normal.     Left Ear: Tympanic membrane, ear canal and external ear normal.     Nose: Congestion and rhinorrhea present. No mucosal edema.     Mouth/Throat:     Mouth: Mucous membranes are not dry.     Pharynx: Uvula midline. No oropharyngeal exudate, posterior oropharyngeal erythema or uvula swelling.      Tonsils: No tonsillar abscesses.  Eyes:     General:        Right eye: No discharge.        Left eye: No discharge.     Conjunctiva/sclera: Conjunctivae normal.  Cardiovascular:     Rate and Rhythm: Normal rate and regular rhythm.     Heart sounds: Normal heart sounds.  Pulmonary:     Effort: Pulmonary effort is normal. No respiratory distress.     Breath sounds: Normal breath sounds. No wheezing or rales.  Abdominal:     Palpations: Abdomen is soft.     Tenderness: There is no abdominal tenderness.  Musculoskeletal:     Cervical back: Normal range of motion and neck supple.  Skin:    General: Skin is warm and dry.  Neurological:     Mental Status: He is alert.     (all labs ordered are listed, but only abnormal results are displayed) Labs Reviewed - No data to display  EKG: None  Radiology: No results found.   Procedures   Medications Ordered in the ED - No data to display  ED Course  Patient seen and examined. History obtained directly from patient. Work-up including labs, imaging, EKG ordered in triage, if performed, were reviewed.    Labs/EKG: None ordered  Imaging: None ordered  Medications/Fluids: None ordered  Most recent vital signs reviewed and are as follows: BP (!) 156/82   Pulse 99   Temp 100.1 F (37.8 C) (Oral)   Resp 18   SpO2 100%   Initial impression: High clinical suspicion for influenza without complication  Home treatment plan: Over-the-counter medications as directed on the packaging, rest, hydration; we did discuss Tamiflu but patient declines  Return instructions discussed with patient: Return with increasing shortness of breath, increased work of breathing, persistent vomiting, fever/confusion.  Follow-up instructions discussed with patient: Primary care in 5 days if not improving.                                   Medical Decision Making  Patient presents with flulike symptoms.  Here with girlfriend who recently tested  positive for influenza type A.  Patient is well-appearing.  Vital signs are stable, fevers controlled.  No clinical signs and symptoms of significant dehydration and is currently tolerating fluids.  Considered secondary infection such as pneumonia, however lungs are clear on exam and patient without hypoxia.  Low concern for sepsis.  In addition, low clinical concern for mononucleosis.   Given well appearance, supportive therapy indicated currently.  Discussed signs and symptoms to return with patient at bedside.  Patient seems reliable to return as needed.       Final diagnoses:  Influenza-like illness    ED Discharge Orders     None          Desiderio Chew, PA-C 05/09/24 1106    Neysa Caron PARAS, [place] 05/09/24 1315  "

## 2024-05-09 NOTE — Discharge Instructions (Signed)
 Please read and follow all provided instructions.  Your diagnoses today include:  1. Influenza-like illness     Tests performed today include: Vital signs. See below for your results today.   Medications prescribed:  Please use over-the-counter NSAID medications (ibuprofen , naproxen ) or Tylenol  (acetaminophen ) as directed on the packaging for pain -- as long as you do not have any reasons avoid these medications. Reasons to avoid NSAID medications include: weak kidneys, a history of bleeding in your stomach or gut, or uncontrolled high blood pressure or previous heart attack. Reasons to avoid Tylenol  include: liver problems or ongoing alcohol use. Never take more than 4000mg  or 8 Extra strength Tylenol  in a 24 hour period.     Take any prescribed medications only as directed.  Home care instructions:  Follow any educational materials contained in this packet. Please continue drinking plenty of fluids. Use over-the-counter cold and flu medications as needed as directed on packaging for symptom relief. You may also use ibuprofen  or tylenol  as directed on packaging for pain or fever.   BE VERY CAREFUL not to take multiple medicines containing Tylenol  (also called acetaminophen ). Doing so can lead to an overdose which can damage your liver and cause liver failure and possibly death.   Follow-up instructions: Please follow-up with your primary care provider in the next 5 days for further evaluation of your symptoms if not feeling better.    Return instructions:  Please return to the Emergency Department if you experience worsening symptoms. Please return if you have a high fever greater than 101 degrees not controlled with over-the-counter medications, persistent vomiting and cannot keep down fluids, or worsening trouble breathing. Please return if you have any other emergent concerns.  Additional Information:  Your vital signs today were: BP (!) 156/82   Pulse 99   Temp 100.1 F (37.8 C)  (Oral)   Resp 18   SpO2 100%  If your blood pressure (BP) was elevated above 135/85 this visit, please have this repeated by your doctor within one month.

## 2024-05-09 NOTE — ED Triage Notes (Signed)
 Cough, congestion, body aches.  States girlfriend tested +flu 3 days ago
